# Patient Record
Sex: Male | Born: 1995 | Race: Black or African American | Hispanic: No | Marital: Single | Smoking: Never smoker
Health system: Southern US, Community
[De-identification: ages and names within clinical notes are randomized; demographics above are authoritative.]

## PROBLEM LIST (undated history)

## (undated) DIAGNOSIS — J45909 Unspecified asthma, uncomplicated: Secondary | ICD-10-CM

## (undated) DIAGNOSIS — G43909 Migraine, unspecified, not intractable, without status migrainosus: Secondary | ICD-10-CM

## (undated) HISTORY — PX: CLOSED REDUCTION WRIST FRACTURE: SHX1091

---
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
## 2017-02-06 ENCOUNTER — Emergency Department (HOSPITAL_COMMUNITY): Payer: BLUE CROSS/BLUE SHIELD

## 2017-02-06 ENCOUNTER — Emergency Department (HOSPITAL_COMMUNITY)
Admission: EM | Admit: 2017-02-06 | Discharge: 2017-02-06 | Disposition: A | Discharge status: Home / Self Care | Payer: BLUE CROSS/BLUE SHIELD | Attending: Emergency Medicine | Admitting: Emergency Medicine

## 2017-02-06 ENCOUNTER — Other Ambulatory Visit: Payer: Self-pay

## 2017-02-06 ENCOUNTER — Encounter (HOSPITAL_COMMUNITY): Payer: Self-pay | Admitting: *Deleted

## 2017-02-06 DIAGNOSIS — J45901 Unspecified asthma with (acute) exacerbation: Secondary | ICD-10-CM | POA: Insufficient documentation

## 2017-02-06 DIAGNOSIS — R0789 Other chest pain: Secondary | ICD-10-CM

## 2017-02-06 LAB — CBC
HEMATOCRIT: 45 % (ref 39.0–52.0)
Hemoglobin: 14.8 g/dL (ref 13.0–17.0)
MCH: 29.7 pg (ref 26.0–34.0)
MCHC: 32.9 g/dL (ref 30.0–36.0)
MCV: 90.4 fL (ref 78.0–100.0)
PLATELETS: 225 10*3/uL (ref 150–400)
RBC: 4.98 MIL/uL (ref 4.22–5.81)
RDW: 13.3 % (ref 11.5–15.5)
WBC: 4.8 10*3/uL (ref 4.0–10.5)

## 2017-02-06 LAB — BASIC METABOLIC PANEL
Anion gap: 11 (ref 5–15)
BUN: 7 mg/dL (ref 6–20)
CHLORIDE: 104 mmol/L (ref 101–111)
CO2: 24 mmol/L (ref 22–32)
CREATININE: 0.88 mg/dL (ref 0.61–1.24)
Calcium: 9.4 mg/dL (ref 8.9–10.3)
GFR calc Af Amer: >60 mL/min (ref >60)
GFR calc non Af Amer: >60 mL/min (ref >60)
GLUCOSE: 96 mg/dL (ref 65–99)
POTASSIUM: 3.9 mmol/L (ref 3.5–5.1)
SODIUM: 139 mmol/L (ref 135–145)

## 2017-02-06 LAB — I-STAT TROPONIN, ED: Troponin i, poc: 0.01 ng/mL (ref 0.00–0.08)

## 2017-02-06 MED ORDER — ALBUTEROL SULFATE HFA 108 (90 BASE) MCG/ACT IN AERS: 2.0000 | INHALATION_SPRAY | RESPIRATORY_TRACT | 2 refills | Status: AC | PRN

## 2017-02-06 MED ORDER — ALBUTEROL SULFATE HFA 108 (90 BASE) MCG/ACT IN AERS
1.0000 | INHALATION_SPRAY | RESPIRATORY_TRACT | Status: DC | PRN
  Administered 2017-02-06: 1 via RESPIRATORY_TRACT
  Filled 2017-02-06: qty 6.7

## 2017-02-06 NOTE — ED Notes (Signed)
Lab work, radiology results and vital signs reviewed, no critical results at this time, no change in acuity indicated.  

## 2017-02-06 NOTE — ED Triage Notes (Signed)
Pt reports chest tightness and sob that started this afternoon. Hx of asthma but states this feels different. Pt was unable to find his inhaler pta to use it. ekg done at triage. No resp distress is noted. spo2 98%.

## 2017-02-06 NOTE — ED Notes (Signed)
The pt is  C/o chest pain since 1440 today  His pain is worse with movement and with respirations  Alert oriented skin warm and dry  No distress

## 2017-02-06 NOTE — ED Provider Notes (Signed)
[facility]   CSN: [id number] Arrival date & time: 02/06/17  1506   History   Chief Complaint Chief Complaint  Patient presents with  . Chest Pain  . Shortness of Breath    HPI Billy Salas is a 22 y.o. male.  HPI   22 year old male presents today with complaints of chest pain or shortness of breath.  Patient reports approximately 2:00 today he was doing homework when he felt chest tightness and substernal chest pain.  He notes his heart was racing at that time, he tried to use his inhaler but was unable to find this.  Patient does have a history of asthma.  Patient notes that symptoms have significantly improved without intervention.  He denies any fever, cough, congestion.  Patient has had pain in the past of his chest as he has pectus excavated him, but notes this feels slightly different.  He does endorse some tenderness along the sternum and chest wall, notes that the chest pain is worsened with movement of the torso and chest.  He denies any abdominal pain or indigestion.  Patient denies any heavy lifting, notes he was at the gym today lifting weights that he normally does.  He denies any significant cardiac history.  He endorses some wheezing at that time.    Past Medical History:  Diagnosis Date  . Asthma     Patient Active Problem List   Diagnosis Date Noted  . PCP NOTES >>>>>>>>>>> 10/11/2015  . SOB (shortness of breath) 04/01/2013  . Annual physical exam 10/10/2011    Past Surgical History:  Procedure Laterality Date  . CLOSED REDUCTION WRIST FRACTURE         Home Medications    Prior to Admission medications   Medication Sig Start Date End Date Taking? Authorizing Provider  albuterol (PROVENTIL HFA;VENTOLIN HFA) 108 (90 Base) MCG/ACT inhaler Inhale 2 puffs into the lungs every 4 (four) hours as needed for wheezing or shortness of breath. 02/06/17   Eyvonne MechanicHedges, Sheilia Reznick, PA-C    Family History Family  History  Problem Relation Age of Onset  . Heart failure Other     Social History Social History   Tobacco Use  . Smoking status: Never Smoker  . Smokeless tobacco: Never Used  Substance Use Topics  . Alcohol use: No  . Drug use: No     Allergies   Patient has no known allergies.   Review of Systems Review of Systems  All other systems reviewed and are negative.    Physical Exam Updated Vital Signs BP (!) 143/76   Pulse 64   Temp 98.8 F (37.1 C) (Oral)   Resp 16   Ht 6\' 5"  (1.956 m)   Wt 86.2 kg (190 lb)   SpO2 99%   BMI 22.53 kg/m   Physical Exam  Constitutional: He is oriented to person, place, and time. He appears well-developed and well-nourished.  HENT:  Head: Normocephalic and atraumatic.  Eyes: Conjunctivae are normal. Pupils are equal, round, and reactive to light. Right eye exhibits no discharge. Left eye exhibits no discharge. No scleral icterus.  Neck: Normal range of motion. No JVD present. No tracheal deviation present.  Cardiovascular: Normal rate, regular rhythm and normal heart sounds. Exam reveals no gallop and no friction rub.  No murmur heard. Pulmonary/Chest: Effort normal. No stridor.  Pectus excavating-tenderness to palpation of the sternum, no redness or swelling heart, lung sounds clear throughout no respiratory distress  Musculoskeletal: He exhibits  no edema.  Neurological: He is alert and oriented to person, place, and time. Coordination normal.  Skin: Skin is warm.  Psychiatric: He has a normal mood and affect. His behavior is normal. Judgment and thought content normal.  Nursing note and vitals reviewed.   ED Treatments / Results  Labs (all labs ordered are listed, but only abnormal results are displayed) Labs Reviewed  BASIC METABOLIC PANEL  CBC  I-STAT TROPONIN, ED    EKG  EKG Interpretation None       Radiology Dg Chest 2 View  Result Date: 02/06/2017 CLINICAL DATA:  Mid chest pain, shortness of breath, and  tachycardia for the past 3 hours. History of asthma, pectus excavatum common nonsmoker. EXAM: CHEST  2 VIEW COMPARISON:  Chest x-ray of July 13, 2015 FINDINGS: The lungs are well-expanded. There is no focal infiltrate. There is no pleural effusion, pneumothorax, or pneumomediastinum. The heart and pulmonary vascularity are normal. The mediastinum is normal in width. The bony thorax exhibits no acute abnormality. IMPRESSION: There is no active cardiopulmonary disease. Electronically Signed   By: David  [place] M.D.   On: 02/06/2017 17:00    Procedures Procedures (including critical care time)  Medications Ordered in ED Medications  albuterol (PROVENTIL HFA;VENTOLIN HFA) 108 (90 Base) MCG/ACT inhaler 1 puff (1 puff Inhalation Given 02/06/17 1951)     Initial Impression / Assessment and Plan / ED Course  I have reviewed the triage vital signs and the nursing notes.  Pertinent labs & imaging results that were available during my care of the patient were reviewed by me and considered in my medical decision making (see chart for details).      Final Clinical Impressions(s) / ED Diagnoses   Final diagnoses:  Chest wall pain  Mild asthma with exacerbation, unspecified whether persistent   Labs: I-STAT troponin, BMP, CBC  Imaging: DG chest 2 view  Consults:  Therapeutics:  Discharge Meds: Albuterol  Assessment/Plan: 22 year old male presents today with chest pain.  This is likely multifactorial.  He does have reproducible chest wall pain.  I will suspect that he likely had minor asthma attack with a component of anxiety.  Patient is well-appearing in no acute distress, he has very reassuring laboratory analysis and physical exam.  His vital signs are reassuring as well.  He has no significant past cardiac history, very low suspicion for ACS, no significant risk factors or etiology for PE.  Patient will be discharged home with albuterol, both patient and his family verbalized understanding  and agreement to today's plan.  He is given strict return precautions.  No further questions or concerns at the time of discharge.     ED Discharge Orders        Ordered    albuterol (PROVENTIL HFA;VENTOLIN HFA) 108 (90 Base) MCG/ACT inhaler  Every 4 hours PRN     02/06/17 1942       Rosalio Loud 02/06/17 2010    Linwood Dibbles, MD 02/08/17 1141

## 2017-02-06 NOTE — Discharge Instructions (Signed)
Please read attached information. If you experience any new or worsening signs or symptoms please return to the emergency room for evaluation. Please follow-up with your primary care provider or specialist as discussed. Please use medication prescribed only as directed and discontinue taking if you have any concerning signs or symptoms.   °

## 2017-06-15 ENCOUNTER — Emergency Department (HOSPITAL_COMMUNITY)
Admission: EM | Admit: 2017-06-15 | Discharge: 2017-06-15 | Disposition: A | Discharge status: Home / Self Care | Payer: BLUE CROSS/BLUE SHIELD | Attending: Emergency Medicine | Admitting: Emergency Medicine

## 2017-06-15 ENCOUNTER — Encounter (HOSPITAL_COMMUNITY): Payer: Self-pay | Admitting: Emergency Medicine

## 2017-06-15 DIAGNOSIS — J029 Acute pharyngitis, unspecified: Secondary | ICD-10-CM | POA: Insufficient documentation

## 2017-06-15 DIAGNOSIS — J45909 Unspecified asthma, uncomplicated: Secondary | ICD-10-CM | POA: Insufficient documentation

## 2017-06-15 LAB — GROUP A STREP BY PCR: Group A Strep by PCR: NOT DETECTED

## 2017-06-15 NOTE — ED Triage Notes (Signed)
Patient complains of sinus drainage and sore throat x1 week. Denies fevers, no other complaints at this time.

## 2017-06-15 NOTE — Discharge Instructions (Signed)
Alternate between Tylenol and ibuprofen as needed for pain. Gargle warm salt water and spit it out. It is very important to stay hydrated!  Follow up with your primary care doctor in 5-7 days for recheck of ongoing symptoms and return to emergency department if any new or worsening of symptoms develop or you have any additional concerns.  °

## 2017-06-15 NOTE — ED Provider Notes (Signed)
[facility] EMERGENCY DEPARTMENT Provider Note   CSN: [id number] Arrival date & time: 06/15/17  0705     History   Chief Complaint Chief Complaint  Patient presents with  . URI    HPI Billy Salas is a 22 y.o. male.  The history is provided by the patient and medical records. No language interpreter was used.  URI   Associated symptoms include congestion, rhinorrhea and sore throat. Pertinent negatives include no chest pain, no abdominal pain, no nausea, no vomiting and no cough.   Billy Salas is a 22 y.o. male with hx of asthma who presents to the emergency department complaining of persistent sore throat for the last 5-7 days.  Associated with nasal congestion and sinus drainage.  He has tried cough suppressants and ibuprofen with little improvement.  No known sick contacts.  Father looked in his throat this morning and noticed some small pus pockets, therefore got concerned he may have strep throat. Denies any fever, cough, shortness of breath, wheezing, headache or neck pain.  Past Medical History:  Diagnosis Date  . Asthma     Patient Active Problem List   Diagnosis Date Noted  . PCP NOTES >>>>>>>>>>> 10/11/2015  . SOB (shortness of breath) 04/01/2013  . Annual physical exam 10/10/2011    Past Surgical History:  Procedure Laterality Date  . CLOSED REDUCTION WRIST FRACTURE          Home Medications    Prior to Admission medications   Medication Sig Start Date End Date Taking? Authorizing Provider  albuterol (PROVENTIL HFA;VENTOLIN HFA) 108 (90 Base) MCG/ACT inhaler Inhale 2 puffs into the lungs every 4 (four) hours as needed for wheezing or shortness of breath. 02/06/17   Eyvonne Mechanic, PA-C    Family History Family History  Problem Relation Age of Onset  . Heart failure Other     Social History Social History   Tobacco Use  . Smoking status: Never Smoker  . Smokeless tobacco: Never Used  Substance Use Topics  . Alcohol  use: No  . Drug use: No     Allergies   Patient has no known allergies.   Review of Systems Review of Systems  Constitutional: Negative for chills and fever.  HENT: Positive for congestion, rhinorrhea, sinus pressure and sore throat. Negative for trouble swallowing.   Respiratory: Negative for cough and shortness of breath.   Cardiovascular: Negative for chest pain.  Gastrointestinal: Negative for abdominal pain, nausea and vomiting.     Physical Exam Updated Vital Signs BP 139/68 (BP Location: Right Arm)   Pulse 77   Temp 98.7 F (37.1 C) (Oral)   Resp 17   Ht  (1.956 m)   Wt 81.6 kg (180 lb)   SpO2 100%   BMI 21.34 kg/m   Physical Exam  Constitutional: He is oriented to person, place, and time. He appears well-developed and well-nourished. No distress.  HENT:  Head: Normocephalic and atraumatic.  OP with erythema and very small exudates. No tonsillar hypertrophy. + nasal congestion with mucosal edema. No focal areas of sinus tenderness.  Neck: Normal range of motion. Neck supple.  No meningeal signs.   Cardiovascular: Normal rate, regular rhythm and normal heart sounds.  Pulmonary/Chest: Effort normal.  Lungs are clear to auscultation bilaterally - no w/r/r  Abdominal: Soft. He exhibits no distension. There is no tenderness.  Musculoskeletal: Normal range of motion.  Neurological: He is alert and oriented to person, place, and time.  Skin:  Skin is warm and dry. He is not diaphoretic.  Nursing note and vitals reviewed.    ED Treatments / Results  Labs (all labs ordered are listed, but only abnormal results are displayed) Labs Reviewed  GROUP A STREP BY PCR    EKG None  Radiology No results found.  Procedures Procedures (including critical care time)  Medications Ordered in ED Medications - No data to display   Initial Impression / Assessment and Plan / ED Course  I have reviewed the triage vital signs and the nursing notes.  Pertinent  labs & imaging results that were available during my care of the patient were reviewed by me and considered in my medical decision making (see chart for details).    Billy Salas is a 22 y.o. male who presents to ED for sore throat and nasal congestion. Tolerating secretions and PO without any difficulty. Clear lungs. Strep PCR negative. Evaluation does not show pathology that would require ongoing emergent intervention or inpatient treatment. Will treat symptomatically and have patient follow up with PCP if symptoms persist. Return precautions discussed and all questions answered.    Final Clinical Impressions(s) / ED Diagnoses   Final diagnoses:  Viral pharyngitis    ED Discharge Orders    None       Jolissa Kapral, Chase Picket, PA-C 06/15/17 1610    Charlynne Pander, MD 06/15/17 [phone]

## 2020-11-02 ENCOUNTER — Ambulatory Visit (HOSPITAL_COMMUNITY)
Admission: EM | Admit: 2020-11-02 | Discharge: 2020-11-02 | Disposition: A | Discharge status: Home / Self Care | Payer: 59 | Attending: Physician Assistant | Admitting: Physician Assistant

## 2020-11-02 ENCOUNTER — Other Ambulatory Visit: Payer: Self-pay

## 2020-11-02 ENCOUNTER — Encounter (HOSPITAL_COMMUNITY): Payer: Self-pay

## 2020-11-02 DIAGNOSIS — R194 Change in bowel habit: Secondary | ICD-10-CM | POA: Diagnosis not present

## 2020-11-02 DIAGNOSIS — R1084 Generalized abdominal pain: Secondary | ICD-10-CM | POA: Diagnosis not present

## 2020-11-02 DIAGNOSIS — R109 Unspecified abdominal pain: Secondary | ICD-10-CM

## 2020-11-02 MED ORDER — HYOSCYAMINE SULFATE SL 0.125 MG SL SUBL: 0.1250 mg | SUBLINGUAL_TABLET | Freq: Two times a day (BID) | SUBLINGUAL | 0 refills | Status: AC | PRN

## 2020-11-02 NOTE — Discharge Instructions (Signed)
Take Levsin twice a day as needed for symptoms.  Make sure you rest and drink plenty of fluid.  I recommend you eat a bland diet such as brat diet (bananas, rice, applesauce, toast) and avoid spicy/acidic/fatty foods as we discussed.  I do think it is reasonable to follow-up with GI specialist if your symptoms are not improving.  If at any point have worsening symptoms including severe abdominal pain, fever, nausea, vomiting, blood in your stool you need to be seen immediately as we discussed.

## 2020-11-02 NOTE — ED Triage Notes (Signed)
Pt presents with recurrent abdominal discomfort that patient describes as a gnawing, cramping feeling since Saturday.

## 2020-11-02 NOTE — ED Provider Notes (Signed)
[facility]    CSN: [id number] Arrival date & time: 11/02/20  1435      History   Chief Complaint Chief Complaint  Patient presents with   Abdominal Pain    HPI Billy Salas is a 25 y.o. male.   Patient presents today with a 4-day history of stomach symptoms.  Reports symptoms began as flatulence but then developed to include generalized abdominal cramping pain with changes in bowel habits.  He reports the sensation that he needs this to the bathroom but has had smaller stools.  Last bowel movement was earlier today and was described as small but otherwise normal without blood or mucus.  He reports occasional nausea but denies any vomiting.  He has had difficulty with his daily activities including work duties as result of symptoms.  Denies any changes to diet, suspicious food intake, recent travel, medication changes, antibiotic use, known sick contacts.  He denies history of gastrointestinal disorder including IBS or inflammatory bowel disease.  Denies previous abdominal surgeries; still has gallbladder and appendix.   Past Medical History:  Diagnosis Date   Asthma     Patient Active Problem List   Diagnosis Date Noted   PCP NOTES >>>>>>>>>>> 10/11/2015   SOB (shortness of breath) 04/01/2013   Annual physical exam 10/10/2011    Past Surgical History:  Procedure Laterality Date   CLOSED REDUCTION WRIST FRACTURE         Home Medications    Prior to Admission medications   Medication Sig Start Date End Date Taking? Authorizing Provider  Hyoscyamine Sulfate SL (LEVSIN/SL) 0.125 MG SUBL Place 0.125 mg under the tongue 2 (two) times daily as needed. 11/02/20  Yes Schylar Wuebker K, PA-C  albuterol (PROVENTIL HFA;VENTOLIN HFA) 108 (90 Base) MCG/ACT inhaler Inhale 2 puffs into the lungs every 4 (four) hours as needed for wheezing or shortness of breath. 02/06/17   Hedges, Tinnie Gens, PA-C    Family History Family History  Problem Relation Age of Onset   Heart  failure Other     Social History Social History   Tobacco Use   Smoking status: Never   Smokeless tobacco: Never  Substance Use Topics   Alcohol use: No   Drug use: No     Allergies   Patient has no known allergies.   Review of Systems Review of Systems  Constitutional:  Positive for activity change. Negative for appetite change, fatigue and fever.  Respiratory:  Negative for cough and shortness of breath.   Cardiovascular:  Negative for chest pain.  Gastrointestinal:  Positive for abdominal pain and nausea. Negative for blood in stool, constipation, diarrhea and vomiting.  Musculoskeletal:  Negative for arthralgias and myalgias.  Neurological:  Negative for dizziness, light-headedness and headaches.    Physical Exam Triage Vital Signs ED Triage Vitals  Enc Vitals Group     BP 11/02/20 1526 130/70     Pulse Rate 11/02/20 1526 (!) 59     Resp 11/02/20 1526 18     Temp 11/02/20 1526 99.4 F (37.4 C)     Temp Source 11/02/20 1526 Oral     SpO2 11/02/20 1526 100 %     Weight --      Height --      Head Circumference --      Peak Flow --      Pain Score 11/02/20 1529 6     Pain Loc --      Pain Edu? --  Excl. in GC? --    No data found.  Updated Vital Signs BP 130/70 (BP Location: Left Arm)   Pulse (!) 59   Temp 99.4 F (37.4 C) (Oral)   Resp 18   SpO2 100%   Visual Acuity Right Eye Distance:   Left Eye Distance:   Bilateral Distance:    Right Eye Near:   Left Eye Near:    Bilateral Near:     Physical Exam Vitals reviewed.  Constitutional:      General: He is awake.     Appearance: Normal appearance. He is well-developed. He is not ill-appearing.     Comments: Very pleasant male appears stated age in no acute distress sitting comfortably in exam room  HENT:     Head: Normocephalic and atraumatic.     Mouth/Throat:     Mouth: Mucous membranes are moist.     Pharynx: Uvula midline. No oropharyngeal exudate or posterior oropharyngeal erythema.   Cardiovascular:     Rate and Rhythm: Normal rate and regular rhythm.     Heart sounds: Normal heart sounds, S1 normal and S2 normal. No murmur heard. Pulmonary:     Effort: Pulmonary effort is normal.     Breath sounds: Normal breath sounds. No stridor. No wheezing, rhonchi or rales.     Comments: Clear to auscultation bilaterally Abdominal:     General: Bowel sounds are normal.     Palpations: Abdomen is soft.     Tenderness: There is no abdominal tenderness. There is no right CVA tenderness, left CVA tenderness, guarding or rebound.     Comments: Benign abdominal exam; nontender to palpation.  No CVA tenderness.  No evidence of acute abdomen on physical exam.  Neurological:     Mental Status: He is alert.  Psychiatric:        Behavior: Behavior is cooperative.     UC Treatments / Results  Labs (all labs ordered are listed, but only abnormal results are displayed) Labs Reviewed - No data to display  EKG   Radiology No results found.  Procedures Procedures (including critical care time)  Medications Ordered in UC Medications - No data to display  Initial Impression / Assessment and Plan / UC Course  I have reviewed the triage vital signs and the nursing notes.  Pertinent labs & imaging results that were available during my care of the patient were reviewed by me and considered in my medical decision making (see chart for details).      Vital signs and physical exam reassuring today; no indication for emergent evaluation or imaging.  Given patient has been able to eat and drink and has not had significant diarrhea or nausea additional testing including lab work and stool studies was not obtained.  Will treat symptomatically with Levsin twice daily as needed.  Recommended that he rest and drink plenty of fluid.  Discussed the importance of a bland diet and instructed him to avoid spicy/acidic/fatty foods.  He was given work excuse note for several days to allow symptoms to  resolve prior to having to return to work.  Discussed that if symptoms persist or if at any point anything worsens he is to be reevaluated.  Discussed alarm symptoms that warrant emergent evaluation including fever, blood in stool, vomiting, hematochezia, melena, lightheadedness, focal abdominal pain.  Strict return precautions given to which he expressed understanding.  Final Clinical Impressions(s) / UC Diagnoses   Final diagnoses:  Generalized abdominal pain  Abdominal cramping  Change in  bowel habit     Discharge Instructions      Take Levsin twice a day as needed for symptoms.  Make sure you rest and drink plenty of fluid.  I recommend you eat a bland diet such as brat diet (bananas, rice, applesauce, toast) and avoid spicy/acidic/fatty foods as we discussed.  I do think it is reasonable to follow-up with GI specialist if your symptoms are not improving.  If at any point have worsening symptoms including severe abdominal pain, fever, nausea, vomiting, blood in your stool you need to be seen immediately as we discussed.     ED Prescriptions     Medication Sig Dispense Auth. Provider   Hyoscyamine Sulfate SL (LEVSIN/SL) 0.125 MG SUBL Place 0.125 mg under the tongue 2 (two) times daily as needed. 20 tablet Zakiyyah Savannah, Noberto Retort, PA-C      PDMP not reviewed this encounter.   Jeani Hawking, PA-C 11/02/20 1605

## 2020-11-29 ENCOUNTER — Ambulatory Visit (HOSPITAL_COMMUNITY)
Admission: EM | Admit: 2020-11-29 | Discharge: 2020-11-29 | Disposition: A | Discharge status: Home / Self Care | Payer: 59 | Attending: Emergency Medicine | Admitting: Emergency Medicine

## 2020-11-29 ENCOUNTER — Encounter (HOSPITAL_COMMUNITY): Payer: Self-pay | Admitting: Emergency Medicine

## 2020-11-29 ENCOUNTER — Other Ambulatory Visit: Payer: Self-pay

## 2020-11-29 DIAGNOSIS — B349 Viral infection, unspecified: Secondary | ICD-10-CM | POA: Diagnosis not present

## 2020-11-29 DIAGNOSIS — J45909 Unspecified asthma, uncomplicated: Secondary | ICD-10-CM | POA: Insufficient documentation

## 2020-11-29 DIAGNOSIS — Z20822 Contact with and (suspected) exposure to covid-19: Secondary | ICD-10-CM | POA: Insufficient documentation

## 2020-11-29 LAB — POC INFLUENZA A AND B ANTIGEN (URGENT CARE ONLY)
INFLUENZA A ANTIGEN, POC: NEGATIVE
INFLUENZA B ANTIGEN, POC: NEGATIVE

## 2020-11-29 MED ORDER — IBUPROFEN 800 MG PO TABS: 800.0000 mg | ORAL_TABLET | Freq: Three times a day (TID) | ORAL | 0 refills | Status: AC

## 2020-11-29 MED ORDER — LIDOCAINE VISCOUS HCL 2 % MT SOLN: 15.0000 mL | OROMUCOSAL | 0 refills | Status: AC | PRN

## 2020-11-29 NOTE — ED Triage Notes (Signed)
Pt reports productive cough, runny nose, congestion since Sunday.

## 2020-11-29 NOTE — ED Provider Notes (Addendum)
[facility]    CSN: [id number] Arrival date & time: 11/29/20  1238      History   Chief Complaint Chief Complaint  Patient presents with   Cough   Nasal Congestion    HPI HILERY WINTLE is a 25 y.o. male.    Patient presents with nasal congestion, rhinorrhea, nonproductive cough, sore throat and intermittent generalized headaches for 3 days.  Tolerating food and liquids.  Known sick contact.  Has not attempted treatment of symptoms.  History of asthma.  Denies ear pain, shortness of breath, wheezing, abdominal pain, nausea, vomiting, diarrhea.   Past Medical History:  Diagnosis Date   Asthma     Patient Active Problem List   Diagnosis Date Noted   PCP NOTES >>>>>>>>>>> 10/11/2015   SOB (shortness of breath) 04/01/2013   Annual physical exam 10/10/2011    Past Surgical History:  Procedure Laterality Date   CLOSED REDUCTION WRIST FRACTURE         Home Medications    Prior to Admission medications   Medication Sig Start Date End Date Taking? Authorizing Provider  albuterol (PROVENTIL HFA;VENTOLIN HFA) 108 (90 Base) MCG/ACT inhaler Inhale 2 puffs into the lungs every 4 (four) hours as needed for wheezing or shortness of breath. 02/06/17   Hedges, Tinnie Gens, PA-C  Hyoscyamine Sulfate SL (LEVSIN/SL) 0.125 MG SUBL Place 0.125 mg under the tongue 2 (two) times daily as needed. 11/02/20   Raspet, Noberto Retort, PA-C    Family History Family History  Problem Relation Age of Onset   Heart failure Other     Social History Social History   Tobacco Use   Smoking status: Never   Smokeless tobacco: Never  Substance Use Topics   Alcohol use: No   Drug use: No     Allergies   Patient has no known allergies.   Review of Systems Review of Systems  Constitutional: Negative.   HENT:  Positive for congestion, rhinorrhea and sore throat. Negative for dental problem, drooling, ear discharge, ear pain, facial swelling, hearing loss, mouth sores, nosebleeds,  postnasal drip, sinus pressure, sinus pain, sneezing, tinnitus, trouble swallowing and voice change.   Respiratory:  Positive for cough. Negative for apnea, choking, chest tightness, shortness of breath, wheezing and stridor.   Gastrointestinal: Negative.   Skin: Negative.   Neurological:  Positive for headaches. Negative for dizziness, tremors, seizures, syncope, facial asymmetry, speech difficulty, weakness, light-headedness and numbness.    Physical Exam Triage Vital Signs ED Triage Vitals  Enc Vitals Group     BP 11/29/20 1332 128/75     Pulse Rate 11/29/20 1332 70     Resp 11/29/20 1332 15     Temp 11/29/20 1332 (!) 100.4 F (38 C)     Temp Source 11/29/20 1332 Oral     SpO2 11/29/20 1332 100 %     Weight --      Height --      Head Circumference --      Peak Flow --      Pain Score 11/29/20 1331 3     Pain Loc --      Pain Edu? --      Excl. in GC? --    No data found.  Updated Vital Signs BP 128/75   Pulse 70   Temp (!) 100.4 F (38 C) (Oral)   Resp 15   SpO2 100%   Visual Acuity Right Eye Distance:   Left Eye Distance:   Bilateral Distance:  Right Eye Near:   Left Eye Near:    Bilateral Near:     Physical Exam Constitutional:      Appearance: Normal appearance. He is normal weight.  HENT:     Head: Normocephalic.     Right Ear: Tympanic membrane, ear canal and external ear normal.     Left Ear: Tympanic membrane, ear canal and external ear normal.     Nose: Congestion and rhinorrhea present.     Mouth/Throat:     Mouth: Mucous membranes are moist.     Pharynx: Posterior oropharyngeal erythema present.  Eyes:     Extraocular Movements: Extraocular movements intact.  Cardiovascular:     Rate and Rhythm: Normal rate and regular rhythm.     Pulses: Normal pulses.     Heart sounds: Normal heart sounds.  Pulmonary:     Effort: Pulmonary effort is normal.     Breath sounds: Normal breath sounds.  Skin:    General: Skin is warm and dry.   Neurological:     Mental Status: He is alert and oriented to person, place, and time. Mental status is at baseline.  Psychiatric:        Mood and Affect: Mood normal.        Behavior: Behavior normal.     UC Treatments / Results  Labs (all labs ordered are listed, but only abnormal results are displayed) Labs Reviewed - No data to display  EKG   Radiology No results found.  Procedures Procedures (including critical care time)  Medications Ordered in UC Medications - No data to display  Initial Impression / Assessment and Plan / UC Course  I have reviewed the triage vital signs and the nursing notes.  Pertinent labs & imaging results that were available during my care of the patient were reviewed by me and considered in my medical decision making (see chart for details).  Viral illness  Discussed etiology of symptoms with patient, timeline and possible resolution, covid and flu test pending  1.  Ibuprofen 800 mg 3 times daily as needed 2.  Lidocaine viscous 2% 15 mg every 4 hours as needed 3.  Over-the-counter medication for remaining symptom management 4.  Work note given 5.  Urgent care follow-up as needed Final Clinical Impressions(s) / UC Diagnoses   Final diagnoses:  None   Discharge Instructions   None    ED Prescriptions   None    PDMP not reviewed this encounter.   Valinda Hoar, NP 11/29/20 1448    Valinda Hoar, NP 11/29/20 1449

## 2020-11-29 NOTE — Discharge Instructions (Addendum)
We will contact you if your COVID or flu test is positive.  Please quarantine while you wait for the results.  If your test is negative you may resume normal activities.  If your test is positive please continue to quarantine for at least 5 days from your symptom onset or until you are without a fever for at least 24 hours after the medications.    You may [place] ibuprofen every 8 hours as needed  You may gargle then spit solution for temporary relief every 4 hours as needed   For cough: honey 1/2 to 1 teaspoon (you can dilute the honey in water or another fluid).  You can also use guaifenesin and dextromethorphan for cough. You can use a humidifier for chest congestion and cough.  If you don't have a humidifier, you can sit in the bathroom with the hot shower running.      For sore throat: try warm salt water gargles, cepacol lozenges, throat spray, warm tea or water with lemon/honey, popsicles or ice, or OTC cold relief medicine for throat discomfort.   For congestion: take a daily anti-histamine like Zyrtec, Claritin, and a oral decongestant, such as pseudoephedrine.  You can also use Flonase 1-2 sprays in each nostril daily.   It is important to stay hydrated: drink plenty of fluids (water, gatorade/powerade/pedialyte, juices, or teas) to keep your throat moisturized and help further relieve irritation/discomfort.

## 2020-11-30 LAB — SARS CORONAVIRUS 2 (TAT 6-24 HRS): SARS Coronavirus 2: NEGATIVE

## 2020-12-05 ENCOUNTER — Other Ambulatory Visit: Payer: Self-pay

## 2020-12-05 ENCOUNTER — Emergency Department (HOSPITAL_COMMUNITY)
Admission: EM | Admit: 2020-12-05 | Discharge: 2020-12-05 | Disposition: A | Discharge status: Home / Self Care | Payer: 59 | Attending: Emergency Medicine | Admitting: Emergency Medicine

## 2020-12-05 ENCOUNTER — Emergency Department (HOSPITAL_BASED_OUTPATIENT_CLINIC_OR_DEPARTMENT_OTHER)
Admission: EM | Admit: 2020-12-05 | Discharge: 2020-12-05 | Disposition: A | Discharge status: Home / Self Care | Payer: 59 | Source: Home / Self Care | Attending: Emergency Medicine | Admitting: Emergency Medicine

## 2020-12-05 ENCOUNTER — Encounter (HOSPITAL_COMMUNITY): Payer: Self-pay

## 2020-12-05 ENCOUNTER — Encounter (HOSPITAL_BASED_OUTPATIENT_CLINIC_OR_DEPARTMENT_OTHER): Payer: Self-pay

## 2020-12-05 DIAGNOSIS — J321 Chronic frontal sinusitis: Secondary | ICD-10-CM

## 2020-12-05 DIAGNOSIS — G43909 Migraine, unspecified, not intractable, without status migrainosus: Secondary | ICD-10-CM | POA: Insufficient documentation

## 2020-12-05 DIAGNOSIS — J45909 Unspecified asthma, uncomplicated: Secondary | ICD-10-CM | POA: Insufficient documentation

## 2020-12-05 DIAGNOSIS — H53149 Visual discomfort, unspecified: Secondary | ICD-10-CM | POA: Insufficient documentation

## 2020-12-05 DIAGNOSIS — R519 Headache, unspecified: Secondary | ICD-10-CM | POA: Diagnosis present

## 2020-12-05 DIAGNOSIS — Z5321 Procedure and treatment not carried out due to patient leaving prior to being seen by health care provider: Secondary | ICD-10-CM | POA: Insufficient documentation

## 2020-12-05 HISTORY — DX: Migraine, unspecified, not intractable, without status migrainosus: G43.909

## 2020-12-05 LAB — BASIC METABOLIC PANEL WITH GFR
Anion gap: 10 (ref 5–15)
BUN: 11 mg/dL (ref 6–20)
CO2: 27 mmol/L (ref 22–32)
Calcium: 9.5 mg/dL (ref 8.9–10.3)
Chloride: 102 mmol/L (ref 98–111)
Creatinine, Ser: 0.79 mg/dL (ref 0.61–1.24)
GFR, Estimated: >60 mL/min
Glucose, Bld: 91 mg/dL (ref 70–99)
Potassium: 3.7 mmol/L (ref 3.5–5.1)
Sodium: 139 mmol/L (ref 135–145)

## 2020-12-05 LAB — CBC
HCT: 40.7 % (ref 39.0–52.0)
Hemoglobin: 13 g/dL (ref 13.0–17.0)
MCH: 29.2 pg (ref 26.0–34.0)
MCHC: 31.9 g/dL (ref 30.0–36.0)
MCV: 91.5 fL (ref 80.0–100.0)
Platelets: 297 K/uL (ref 150–400)
RBC: 4.45 MIL/uL (ref 4.22–5.81)
RDW: 12.6 % (ref 11.5–15.5)
WBC: 8.9 K/uL (ref 4.0–10.5)
nRBC: 0 % (ref 0.0–0.2)

## 2020-12-05 MED ORDER — KETOROLAC TROMETHAMINE 15 MG/ML IJ SOLN
15.0000 mg | Freq: Once | INTRAMUSCULAR | Status: AC
  Administered 2020-12-05: 15 mg via INTRAMUSCULAR
  Filled 2020-12-05: qty 1

## 2020-12-05 MED ORDER — AMOXICILLIN-POT CLAVULANATE 875-125 MG PO TABS: 1.0000 | ORAL_TABLET | Freq: Two times a day (BID) | ORAL | 0 refills | Status: AC

## 2020-12-05 MED ORDER — DIPHENHYDRAMINE HCL 50 MG/ML IJ SOLN
25.0000 mg | Freq: Once | INTRAMUSCULAR | Status: AC
  Administered 2020-12-05: 25 mg via INTRAVENOUS
  Filled 2020-12-05: qty 1

## 2020-12-05 MED ORDER — AMOXICILLIN-POT CLAVULANATE 875-125 MG PO TABS
1.0000 | ORAL_TABLET | Freq: Once | ORAL | Status: AC
  Administered 2020-12-05: 1 via ORAL
  Filled 2020-12-05: qty 1

## 2020-12-05 MED ORDER — DEXAMETHASONE 4 MG PO TABS
10.0000 mg | ORAL_TABLET | Freq: Once | ORAL | Status: AC
  Administered 2020-12-05: 10 mg via ORAL
  Filled 2020-12-05: qty 3

## 2020-12-05 MED ORDER — DROPERIDOL 2.5 MG/ML IJ SOLN
1.2500 mg | Freq: Once | INTRAMUSCULAR | Status: AC
  Administered 2020-12-05: 1.25 mg via INTRAVENOUS
  Filled 2020-12-05: qty 2

## 2020-12-05 MED ORDER — METOCLOPRAMIDE HCL 5 MG/ML IJ SOLN
10.0000 mg | Freq: Once | INTRAMUSCULAR | Status: AC
  Administered 2020-12-05: 10 mg via INTRAMUSCULAR
  Filled 2020-12-05: qty 2

## 2020-12-05 MED ORDER — SODIUM CHLORIDE 0.9 % IV BOLUS
1000.0000 mL | Freq: Once | INTRAVENOUS | Status: AC
  Administered 2020-12-05: 1000 mL via INTRAVENOUS

## 2020-12-05 NOTE — ED Notes (Signed)
Pt left AMA due to wait time  

## 2020-12-05 NOTE — ED Provider Notes (Signed)
Emergency Medicine Provider Triage Evaluation Note  Billy Salas , a 25 y.o. male  was evaluated in triage.  Pt complains of intermittent frontal headache with associated photophobia x 1 week. Had some preceding URI symptoms, congestion. No fevers, head injury, vomiting, extremity numbness or paresthesias, extremity weakness.  Review of Systems  Positive: As above Negative: As above  Physical Exam  BP 117/77 (BP Location: Left Arm)   Pulse 76   Temp 98 F (36.7 C)   Resp 16   SpO2 99%  Gen:   Awake, no distress   Resp:  Normal effort  MSK:   Moves extremities without difficulty  Other:  No focal deficits noted  Medical Decision Making  Medically screening exam initiated at 3:19 AM.  Appropriate orders placed.  Billy Salas was informed that the remainder of the evaluation will be completed by another provider, this initial triage assessment does not replace that evaluation, and the importance of remaining in the ED until their evaluation is complete.  Frontal headache - medications ordered pending formal ED evaluation.   Antony Madura, PA-C 12/05/20 0320    Dione Booze, MD 12/05/20 [phone]

## 2020-12-05 NOTE — ED Notes (Signed)
Patient verbalizes understanding of discharge instructions. Opportunity for questioning and answers were provided. Patient discharged from ED.  °

## 2020-12-05 NOTE — Discharge Instructions (Signed)
Take tylenol 2 pills 4 times a day and motrin 4 pills 3 times a day.  Drink plenty of fluids.  Return for worsening shortness of breath, headache, confusion. Follow up with your family doctor.   

## 2020-12-05 NOTE — ED Triage Notes (Addendum)
Pt arrives POV states he has had a migraine for approximately one week.  Denies N/V.  Endorses some photosensitivity but denies any vision changes.   Was seen at [facility] ED earlier this am and was given IM injection of Reglan and Toradol.  Has also tried Ibuprofen 800 mg without relief.

## 2020-12-05 NOTE — ED Provider Notes (Signed)
[facility] EMERGENCY DEPT Provider Note   CSN: [id number] Arrival date & time: 12/05/20  0908     History Chief Complaint  Patient presents with   Migraine    Billy Salas is a 25 y.o. male.  25 yo M with a chief complaints of a headache.  This been going on for about a week.  Preceded by an upper respiratory illness cough congestion fevers.  Was at the [facility] emergency department overnight last night but did not wait to be seen.  Was given Reglan and Toradol and triage with out relief.  Denies ongoing fevers or chills.  Denies head trauma.  Denies one-sided numbness or weakness denies difficulty speech or swallowing.  The history is provided by the patient.  Migraine This is a new problem. The current episode started 2 days ago. The problem occurs constantly. Associated symptoms include headaches. Pertinent negatives include no chest pain, no abdominal pain and no shortness of breath. Nothing aggravates the symptoms. Nothing relieves the symptoms. He has tried nothing for the symptoms. The treatment provided no relief.      Past Medical History:  Diagnosis Date   Asthma    Migraine    optic migraine    Patient Active Problem List   Diagnosis Date Noted   PCP NOTES >>>>>>>>>>> 10/11/2015   SOB (shortness of breath) 04/01/2013   Annual physical exam 10/10/2011    Past Surgical History:  Procedure Laterality Date   CLOSED REDUCTION WRIST FRACTURE         Family History  Problem Relation Age of Onset   Heart failure Other     Social History   Tobacco Use   Smoking status: Never   Smokeless tobacco: Never  Vaping Use   Vaping Use: Never used  Substance Use Topics   Alcohol use: Yes    Comment: occ   Drug use: Yes    Types: Marijuana    Comment: daily use    Home Medications Prior to Admission medications   Medication Sig Start Date End Date Taking? Authorizing Provider  amoxicillin-clavulanate (AUGMENTIN) 875-125 MG tablet Take 1 tablet  by mouth every 12 (twelve) hours. 12/05/20  Yes Melene Plan, DO  albuterol (PROVENTIL HFA;VENTOLIN HFA) 108 (90 Base) MCG/ACT inhaler Inhale 2 puffs into the lungs every 4 (four) hours as needed for wheezing or shortness of breath. 02/06/17   Hedges, Tinnie Gens, PA-C  cetirizine (ZYRTEC) 10 MG tablet Take 10 mg by mouth daily as needed for allergies.    [provider]  Hyoscyamine Sulfate SL (LEVSIN/SL) 0.125 MG SUBL Place 0.125 mg under the tongue 2 (two) times daily as needed. Patient not taking: Reported on 12/05/2020 11/02/20   Raspet, Noberto Retort, PA-C  ibuprofen (ADVIL) 800 MG tablet Take 1 tablet (800 mg total) by mouth 3 (three) times daily. Patient taking differently: Take 800 mg by mouth every 8 (eight) hours as needed for headache or moderate pain. 11/29/20   White, Elita Boone, NP  lidocaine (XYLOCAINE) 2 % solution Use as directed 15 mLs in the mouth or throat every 4 (four) hours as needed for mouth pain. 11/29/20   Valinda Hoar, NP    Allergies    Patient has no known allergies.  Review of Systems   Review of Systems  Constitutional:  Negative for chills and fever.  HENT:  Positive for congestion. Negative for facial swelling.   Eyes:  Negative for discharge and visual disturbance.  Respiratory:  Positive for cough. Negative for shortness  of breath.   Cardiovascular:  Negative for chest pain and palpitations.  Gastrointestinal:  Negative for abdominal pain, diarrhea and vomiting.  Musculoskeletal:  Negative for arthralgias and myalgias.  Skin:  Negative for color change and rash.  Neurological:  Positive for headaches. Negative for tremors and syncope.  Psychiatric/Behavioral:  Negative for confusion and dysphoric mood.    Physical Exam Updated Vital Signs BP 122/67   Pulse 67   Temp 98.3 F (36.8 C) (Oral)   Resp 16   Ht 6\' 6"  (1.981 m)   Wt 86.2 kg   SpO2 100%   BMI 21.96 kg/m   Physical Exam Vitals and nursing note reviewed.  Constitutional:       Appearance: He is well-developed.  HENT:     Head: Normocephalic and atraumatic.  Eyes:     Pupils: Pupils are equal, round, and reactive to light.  Neck:     Vascular: No JVD.  Cardiovascular:     Rate and Rhythm: Normal rate and regular rhythm.     Heart sounds: No murmur heard.   No friction rub. No gallop.  Pulmonary:     Effort: No respiratory distress.     Breath sounds: No wheezing.  Abdominal:     General: There is no distension.     Tenderness: There is no abdominal tenderness. There is no guarding or rebound.  Musculoskeletal:        General: Normal range of motion.     Cervical back: Normal range of motion and neck supple.  Skin:    Coloration: Skin is not pale.     Findings: No rash.  Neurological:     Mental Status: He is alert and oriented to person, place, and time.     GCS: GCS eye subscore is 4. GCS verbal subscore is 5. GCS motor subscore is 6.     Cranial Nerves: Cranial nerves 2-12 are intact.     Sensory: Sensation is intact.     Motor: Motor function is intact.     Coordination: Coordination is intact.     Comments: Benign neuro exam able to walk in crocs.  Psychiatric:        Behavior: Behavior normal.    ED Results / Procedures / Treatments   Labs (all labs ordered are listed, but only abnormal results are displayed) Labs Reviewed  BASIC METABOLIC PANEL  CBC    EKG None  Radiology No results found.  Procedures Procedures   Medications Ordered in ED Medications  droperidol (INAPSINE) 2.5 MG/ML injection 1.25 mg (1.25 mg Intravenous Given 12/05/20 1036)  sodium chloride 0.9 % bolus 1,000 mL (0 mLs Intravenous Stopped 12/05/20 1226)  diphenhydrAMINE (BENADRYL) injection 25 mg (25 mg Intravenous Given 12/05/20 1038)  amoxicillin-clavulanate (AUGMENTIN) 875-125 MG per tablet 1 tablet (1 tablet Oral Given 12/05/20 1039)  dexamethasone (DECADRON) tablet 10 mg (10 mg Oral Given 12/05/20 1219)    ED Course  I have reviewed the triage vital  signs and the nursing notes.  Pertinent labs & imaging results that were available during my care of the patient were reviewed by me and considered in my medical decision making (see chart for details).    MDM Rules/Calculators/A&P                           25 yo M with a chief complaints of headache.  Going on for the past week.  History of migraines and thinks this feels  the same but somewhat worse.  Patient with upper respiratory symptoms and sinus tenderness to percussion likely sinusitis.  Will start on antibiotics.  Headache cocktail reassess.  Patient is feeling a bit better after headache cocktail.  Will discharge home.  PCP follow-up.  12:49 PM:  I have discussed the diagnosis/risks/treatment options with the  Patient and believe the pt to be eligible for discharge home to follow-up with PCP. We also discussed returning to the ED immediately if new or worsening sx occur. We discussed the sx which are most concerning (e.g., sudden worsening pain, fever, inability to tolerate by mouth) that necessitate immediate return. Medications administered to the patient during their visit and any new prescriptions provided to the patient are listed below.  Medications given during this visit Medications  droperidol (INAPSINE) 2.5 MG/ML injection 1.25 mg (1.25 mg Intravenous Given 12/05/20 1036)  sodium chloride 0.9 % bolus 1,000 mL (0 mLs Intravenous Stopped 12/05/20 1226)  diphenhydrAMINE (BENADRYL) injection 25 mg (25 mg Intravenous Given 12/05/20 1038)  amoxicillin-clavulanate (AUGMENTIN) 875-125 MG per tablet 1 tablet (1 tablet Oral Given 12/05/20 1039)  dexamethasone (DECADRON) tablet 10 mg (10 mg Oral Given 12/05/20 1219)     The patient appears reasonably screen and/or stabilized for discharge and I doubt any other medical condition or other [facility] requiring further screening, evaluation, or treatment in the ED at this time prior to discharge.   Final Clinical Impression(s) / ED  Diagnoses Final diagnoses:  Sinusitis chronic, frontal  Migraine syndrome    Rx / DC Orders ED Discharge Orders          Ordered    amoxicillin-clavulanate (AUGMENTIN) 875-125 MG tablet  Every 12 hours        12/05/20 1115             Deno Etienne, DO 12/05/20 1249

## 2020-12-05 NOTE — ED Triage Notes (Signed)
Pt states that he has had a migraine for the past week that comes and goes, photosensitive, denies nausea.

## 2020-12-07 ENCOUNTER — Other Ambulatory Visit: Payer: Self-pay

## 2020-12-07 ENCOUNTER — Emergency Department (HOSPITAL_BASED_OUTPATIENT_CLINIC_OR_DEPARTMENT_OTHER)
Admission: EM | Admit: 2020-12-07 | Discharge: 2020-12-07 | Disposition: A | Discharge status: Home / Self Care | Payer: 59 | Attending: Emergency Medicine | Admitting: Emergency Medicine

## 2020-12-07 ENCOUNTER — Encounter (HOSPITAL_BASED_OUTPATIENT_CLINIC_OR_DEPARTMENT_OTHER): Payer: Self-pay

## 2020-12-07 DIAGNOSIS — R220 Localized swelling, mass and lump, head: Secondary | ICD-10-CM | POA: Insufficient documentation

## 2020-12-07 DIAGNOSIS — J45909 Unspecified asthma, uncomplicated: Secondary | ICD-10-CM | POA: Diagnosis not present

## 2020-12-07 DIAGNOSIS — L03213 Periorbital cellulitis: Secondary | ICD-10-CM

## 2020-12-07 NOTE — ED Triage Notes (Signed)
Onset last night of swelling around eye.  Noted some drainage on eye lashes. Denies any vision changes.  Yesterday eyes itchy

## 2020-12-07 NOTE — Discharge Instructions (Signed)
Apply a warm towel to your upper eyelid 3 times a day for 10 minutes Return here if you develop any trouble with vision, eye begins to bulge out, or any other problems

## 2020-12-07 NOTE — ED Provider Notes (Addendum)
[facility] EMERGENCY DEPT Provider Note   CSN: [id number] Arrival date & time: 12/07/20  2706     History Chief Complaint  Patient presents with   Facial Swelling    Billy Salas is a 25 y.o. male.  25 year old male presents with swelling to his left upper eyelid.  Denies any history of trauma.  Has noted some crusting to his eyelashes on left upper eyelid.  No fever or chills.  Seen recently and is on amoxicillin at this time.  Denies any blurred vision or diplopia.  No eyeball pain.      Past Medical History:  Diagnosis Date   Asthma    Migraine    optic migraine    Patient Active Problem List   Diagnosis Date Noted   PCP NOTES >>>>>>>>>>> 10/11/2015   SOB (shortness of breath) 04/01/2013   Annual physical exam 10/10/2011    Past Surgical History:  Procedure Laterality Date   CLOSED REDUCTION WRIST FRACTURE         Family History  Problem Relation Age of Onset   Heart failure Other     Social History   Tobacco Use   Smoking status: Never   Smokeless tobacco: Never  Vaping Use   Vaping Use: Never used  Substance Use Topics   Alcohol use: Yes    Comment: occ   Drug use: Yes    Types: Marijuana    Comment: daily use    Home Medications Prior to Admission medications   Medication Sig Start Date End Date Taking? Authorizing Provider  albuterol (PROVENTIL HFA;VENTOLIN HFA) 108 (90 Base) MCG/ACT inhaler Inhale 2 puffs into the lungs every 4 (four) hours as needed for wheezing or shortness of breath. 02/06/17   Hedges, Tinnie Gens, PA-C  amoxicillin-clavulanate (AUGMENTIN) 875-125 MG tablet Take 1 tablet by mouth every 12 (twelve) hours. 12/05/20   Melene Plan, DO  cetirizine (ZYRTEC) 10 MG tablet Take 10 mg by mouth daily as needed for allergies.    [provider]  Hyoscyamine Sulfate SL (LEVSIN/SL) 0.125 MG SUBL Place 0.125 mg under the tongue 2 (two) times daily as needed. Patient not taking: Reported on 12/05/2020 11/02/20    Raspet, Noberto Retort, PA-C  ibuprofen (ADVIL) 800 MG tablet Take 1 tablet (800 mg total) by mouth 3 (three) times daily. Patient taking differently: Take 800 mg by mouth every 8 (eight) hours as needed for headache or moderate pain. 11/29/20   White, Elita Boone, NP  lidocaine (XYLOCAINE) 2 % solution Use as directed 15 mLs in the mouth or throat every 4 (four) hours as needed for mouth pain. 11/29/20   Valinda Hoar, NP    Allergies    Patient has no known allergies.  Review of Systems   Review of Systems  All other systems reviewed and are negative.  Physical Exam Updated Vital Signs BP 137/83 (BP Location: Right Arm)   Pulse 74   Temp 99 F (37.2 C) (Oral)   Resp 16   Ht 1.981 m (6\' 6" )   Wt 86.2 kg   SpO2 100%   BMI 21.96 kg/m   Physical Exam Vitals and nursing note reviewed.  Constitutional:      Appearance: He is well-developed. He is not toxic-appearing.  HENT:     Head: Normocephalic and atraumatic.  Eyes:     Conjunctiva/sclera: Conjunctivae normal.     Pupils: Pupils are equal, round, and reactive to light.     Comments: Edema noted to left upper eyelid.  No evidence of entrapment.  No exophthalmos.  No drainage appreciated.  Cardiovascular:     Rate and Rhythm: Normal rate.  Pulmonary:     Effort: Pulmonary effort is normal.  Musculoskeletal:     Cervical back: Normal range of motion.  Skin:    General: Skin is warm and dry.  Neurological:     Mental Status: He is alert and oriented to person, place, and time.    ED Results / Procedures / Treatments   Labs (all labs ordered are listed, but only abnormal results are displayed) Labs Reviewed - No data to display  EKG None  Radiology No results found.  Procedures Procedures   Medications Ordered in ED Medications - No data to display  ED Course  I have reviewed the triage vital signs and the nursing notes.  Pertinent labs & imaging results that were available during my care of the patient were  reviewed by me and considered in my medical decision making (see chart for details).    MDM Rules/Calculators/A&P                           Patient with likely developing preseptal cellulitis.  Patient is on Augmentin at this time.  He is afebrile.  Have informed to continue his current therapy and return precautions given final Clinical Impression(s) / ED Diagnoses Final diagnoses:  None    Rx / DC Orders ED Discharge Orders     None        Lorre Nick, MD 12/07/20 7353    Lorre Nick, MD 12/07/20 [phone]

## 2021-03-28 ENCOUNTER — Other Ambulatory Visit: Payer: Self-pay

## 2021-03-28 ENCOUNTER — Encounter (HOSPITAL_BASED_OUTPATIENT_CLINIC_OR_DEPARTMENT_OTHER): Payer: Self-pay | Admitting: Emergency Medicine

## 2021-03-28 DIAGNOSIS — J45909 Unspecified asthma, uncomplicated: Secondary | ICD-10-CM | POA: Insufficient documentation

## 2021-03-28 DIAGNOSIS — L02416 Cutaneous abscess of left lower limb: Secondary | ICD-10-CM | POA: Diagnosis not present

## 2021-03-28 NOTE — ED Triage Notes (Signed)
Abscess on left leg just below left knee. Pt first noticed 3 days ago and stated that when he walks it drains a clear, yellow drainage.  ?

## 2021-03-29 ENCOUNTER — Emergency Department (HOSPITAL_BASED_OUTPATIENT_CLINIC_OR_DEPARTMENT_OTHER)
Admission: EM | Admit: 2021-03-29 | Discharge: 2021-03-29 | Disposition: A | Discharge status: Home / Self Care | Payer: Medicaid Other | Attending: Emergency Medicine | Admitting: Emergency Medicine

## 2021-03-29 DIAGNOSIS — L02416 Cutaneous abscess of left lower limb: Secondary | ICD-10-CM

## 2021-03-29 MED ORDER — SULFAMETHOXAZOLE-TRIMETHOPRIM 800-160 MG PO TABS: 1.0000 | ORAL_TABLET | Freq: Two times a day (BID) | ORAL | 0 refills | Status: AC

## 2021-03-29 NOTE — ED Provider Notes (Signed)
?[facility]PT ?Provider Note ? ?CSN: [id number] ?Arrival date & time: 03/28/21 2156 ? ?Chief Complaint(s) ?Abscess ? ?HPI ?Billy Salas is a 26 y.o. male   ? ? ?Abscess ?Location:  Leg ?Leg abscess location:  L lower leg ?Size:  1.5 cm ?Abscess quality: draining, induration, painful, redness and warmth   ?Duration:  2 days ?Pain details:  ?  Quality:  Dull ?  Severity:  Moderate ?  Timing:  Constant ?Chronicity:  New ?Context: not diabetes, not insect bite/sting and not skin injury   ?Relieved by:  Nothing ?Worsened by:  Nothing ?Associated symptoms: no fever   ? ?Past Medical History ?Past Medical History:  ?Diagnosis Date  ? Asthma   ? Migraine   ? optic migraine  ? ?Patient Active Problem List  ? Diagnosis Date Noted  ? PCP NOTES >>>>>>>>>>> 10/11/2015  ? SOB (shortness of breath) 04/01/2013  ? Annual physical exam 10/10/2011  ? ?Home Medication(s) ?Prior to Admission medications   ?Medication Sig Start Date End Date Taking? Authorizing Provider  ?sulfamethoxazole-trimethoprim (BACTRIM DS) 800-160 MG tablet Take 1 tablet by mouth 2 (two) times daily for 7 days. 03/29/21 04/05/21 Yes Danni Leabo, Amadeo Garnet, MD  ?albuterol (PROVENTIL HFA;VENTOLIN HFA) 108 (90 Base) MCG/ACT inhaler Inhale 2 puffs into the lungs every 4 (four) hours as needed for wheezing or shortness of breath. 02/06/17   Hedges, Tinnie Gens, PA-C  ?amoxicillin-clavulanate (AUGMENTIN) 875-125 MG tablet Take 1 tablet by mouth every 12 (twelve) hours. 12/05/20   Melene Plan, DO  ?cetirizine (ZYRTEC) 10 MG tablet Take 10 mg by mouth daily as needed for allergies.    [provider]  ?Hyoscyamine Sulfate SL (LEVSIN/SL) 0.125 MG SUBL Place 0.125 mg under the tongue 2 (two) times daily as needed. ?Patient not taking: Reported on 12/05/2020 11/02/20   Raspet, Denny Peon K, PA-C  ?ibuprofen (ADVIL) 800 MG tablet Take 1 tablet (800 mg total) by mouth 3 (three) times daily. ?Patient taking differently: Take 800 mg by mouth every 8  (eight) hours as needed for headache or moderate pain. 11/29/20   Valinda Hoar, NP  ?lidocaine (XYLOCAINE) 2 % solution Use as directed 15 mLs in the mouth or throat every 4 (four) hours as needed for mouth pain. 11/29/20   Valinda Hoar, NP  ?                                                                                                                                  ?Allergies ?Patient has no known allergies. ? ?Review of Systems ?Review of Systems  ?Constitutional:  Negative for fever.  ?As noted in HPI ? ?Physical Exam ?Vital Signs  ?I have reviewed the triage vital signs ?BP 123/81   Pulse (!) 46   Temp 98.2 ?F (36.8 ?C)   Resp 18   Ht 6\' 6"  (1.981 m)   Wt 88.5 kg   SpO2 100%   BMI 22.53 kg/m?  ? ?  Physical Exam ?Vitals reviewed.  ?Constitutional:   ?   General: He is not in acute distress. ?   Appearance: He is well-developed. He is not diaphoretic.  ?HENT:  ?   Head: Normocephalic and atraumatic.  ?   Right Ear: External ear normal.  ?   Left Ear: External ear normal.  ?   Nose: Nose normal.  ?   Mouth/Throat:  ?   Mouth: Mucous membranes are moist.  ?Eyes:  ?   General: No scleral icterus. ?   Conjunctiva/sclera: Conjunctivae normal.  ?Neck:  ?   Trachea: Phonation normal.  ?Cardiovascular:  ?   Rate and Rhythm: Normal rate and regular rhythm.  ?Pulmonary:  ?   Effort: Pulmonary effort is normal. No respiratory distress.  ?   Breath sounds: No stridor.  ?Abdominal:  ?   General: There is no distension.  ?Musculoskeletal:     ?   General: Normal range of motion.  ?   Cervical back: Normal range of motion.  ?     Legs: ? ?Neurological:  ?   Mental Status: He is alert and oriented to person, place, and time.  ?Psychiatric:     ?   Behavior: Behavior normal.  ? ? ?ED Results and Treatments ?Labs ?(all labs ordered are listed, but only abnormal results are displayed) ?Labs Reviewed - No data to display                                                                                                                        ?EKG ? EKG Interpretation ? ?Date/Time:    ?Ventricular Rate:    ?PR Interval:    ?QRS Duration:   ?QT Interval:    ?QTC Calculation:   ?R Axis:     ?Text Interpretation:   ?  ? ?  ? ?Radiology ?No results found. ? ?Pertinent labs & imaging results that were available during my care of the patient were reviewed by me and considered in my medical decision making (see MDM for details). ? ?Medications Ordered in ED ?Medications - No data to display                                                               ?                                                                    ?Procedures ?Procedures ? ?(including critical care time) ? ?Medical Decision Making / ED Course ? ? ? Complexity of Problem: ? ?  Co-morbidities/SDOH that complicate the patient evaluation/care: ?none ? ?Additional history obtained: ?N/a ? ?Patient's presenting problem/concern and DDX listed below: ?Leg wound ?Abscess with cellulitis ? ? ?  Complexity of Data: ?  ?Cardiac Monitoring: ?N/a ? ?Laboratory Tests ordered listed below with my independent interpretation: ?none ?  ?Imaging Studies ordered listed below with my independent interpretation: ?none ?  ?  ?ED Course:   ? ?Hospitalization Considered:  ?no ? ?Assessment, Intervention, and Reassessment: ?Abscess with cellulitis ?Already drained ?No need for I&D here ?RX bactrim  ? ? ?Final Clinical Impression(s) / ED Diagnoses ?Final diagnoses:  ?Cutaneous abscess of left lower extremity  ? ?The patient appears reasonably screened and/or stabilized for discharge and I doubt any other medical condition or other [facility] requiring further screening, evaluation, or treatment in the ED at this time prior to discharge. Safe for discharge with strict return precautions. ? ?Disposition: Discharge ? ?Condition: Good ? ?I have discussed the results, Dx and Tx plan with the patient/family who expressed understanding and agree(s) with the plan. Discharge instructions discussed at length. The  patient/family was given strict return precautions who verbalized understanding of the instructions. No further questions at time of discharge.  ? ? ?ED Discharge Orders   ? ?      Ordered  ?  sulfamethoxazole-trimethoprim (BACTRIM DS) 800-160 MG tablet  2 times daily       ? 03/29/21 0321  ? ?  ?  ? ?  ? ? ? ?Follow Up: ?Primary care provider ? ?Call  ?if symptoms do not improve or  worsen, in 5-7 days ? ? ? ?  ? ? ? ? ? ?This chart was dictated using voice recognition software.  Despite best efforts to proofread,  errors can occur which can change the documentation meaning. ? ?  ?Nira Conn, MD ?03/29/21 [phone] ? ?

## 2021-06-23 ENCOUNTER — Other Ambulatory Visit (HOSPITAL_BASED_OUTPATIENT_CLINIC_OR_DEPARTMENT_OTHER): Payer: Self-pay

## 2021-06-23 ENCOUNTER — Emergency Department (HOSPITAL_BASED_OUTPATIENT_CLINIC_OR_DEPARTMENT_OTHER)
Admission: EM | Admit: 2021-06-23 | Discharge: 2021-06-23 | Disposition: A | Discharge status: Home / Self Care | Payer: No Typology Code available for payment source | Attending: Emergency Medicine | Admitting: Emergency Medicine

## 2021-06-23 ENCOUNTER — Other Ambulatory Visit: Payer: Self-pay

## 2021-06-23 ENCOUNTER — Emergency Department (HOSPITAL_BASED_OUTPATIENT_CLINIC_OR_DEPARTMENT_OTHER): Payer: No Typology Code available for payment source | Admitting: Radiology

## 2021-06-23 ENCOUNTER — Encounter (HOSPITAL_BASED_OUTPATIENT_CLINIC_OR_DEPARTMENT_OTHER): Payer: Self-pay

## 2021-06-23 DIAGNOSIS — M545 Low back pain, unspecified: Secondary | ICD-10-CM | POA: Diagnosis present

## 2021-06-23 DIAGNOSIS — Y92410 Unspecified street and highway as the place of occurrence of the external cause: Secondary | ICD-10-CM | POA: Diagnosis not present

## 2021-06-23 DIAGNOSIS — M5410 Radiculopathy, site unspecified: Secondary | ICD-10-CM

## 2021-06-23 DIAGNOSIS — M5416 Radiculopathy, lumbar region: Secondary | ICD-10-CM | POA: Diagnosis not present

## 2021-06-23 MED ORDER — MELOXICAM 15 MG PO TABS
15.0000 mg | ORAL_TABLET | Freq: Every day | ORAL | 0 refills | Status: AC
  Filled 2021-06-23: qty 15, 15d supply, fill #0

## 2021-06-23 MED ORDER — METHOCARBAMOL 500 MG PO TABS
500.0000 mg | ORAL_TABLET | Freq: Two times a day (BID) | ORAL | 0 refills | Status: AC
  Filled 2021-06-23: qty 20, 10d supply, fill #0
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
## 2021-06-23 NOTE — Discharge Instructions (Addendum)
You were seen today for pain of the lower back and left leg.  Images showed no fracture or dislocation.  Your pain sounds consistent with radicular nerve pain.  I prescribed an anti-inflammatory and a muscle relaxant.  Do not take other NSAID medications while taking the meloxicam.  You may use ice or heat as beneficial.  Follow-up as needed with orthopedics.

## 2021-06-23 NOTE — ED Notes (Signed)
Discharge paperwork given and understood. 
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
## 2021-07-05 ENCOUNTER — Other Ambulatory Visit (HOSPITAL_BASED_OUTPATIENT_CLINIC_OR_DEPARTMENT_OTHER): Payer: Self-pay

## 2021-12-22 DIAGNOSIS — Z419 Encounter for procedure for purposes other than remedying health state, unspecified: Secondary | ICD-10-CM | POA: Diagnosis not present

## 2022-01-22 DIAGNOSIS — Z419 Encounter for procedure for purposes other than remedying health state, unspecified: Secondary | ICD-10-CM | POA: Diagnosis not present

## 2022-02-22 DIAGNOSIS — Z419 Encounter for procedure for purposes other than remedying health state, unspecified: Secondary | ICD-10-CM | POA: Diagnosis not present

## 2022-03-07 ENCOUNTER — Telehealth: Payer: Self-pay

## 2022-03-07 NOTE — Telephone Encounter (Signed)
Mychart msg sent. AS, CMA

## 2022-03-23 DIAGNOSIS — Z419 Encounter for procedure for purposes other than remedying health state, unspecified: Secondary | ICD-10-CM | POA: Diagnosis not present

## 2022-04-23 DIAGNOSIS — Z419 Encounter for procedure for purposes other than remedying health state, unspecified: Secondary | ICD-10-CM | POA: Diagnosis not present

## 2022-05-23 DIAGNOSIS — Z419 Encounter for procedure for purposes other than remedying health state, unspecified: Secondary | ICD-10-CM | POA: Diagnosis not present

## 2022-06-23 DIAGNOSIS — Z419 Encounter for procedure for purposes other than remedying health state, unspecified: Secondary | ICD-10-CM | POA: Diagnosis not present

## 2022-07-23 DIAGNOSIS — Z419 Encounter for procedure for purposes other than remedying health state, unspecified: Secondary | ICD-10-CM | POA: Diagnosis not present

## 2022-07-24 ENCOUNTER — Other Ambulatory Visit: Payer: Self-pay

## 2022-08-23 DIAGNOSIS — Z419 Encounter for procedure for purposes other than remedying health state, unspecified: Secondary | ICD-10-CM | POA: Diagnosis not present

## 2022-09-23 DIAGNOSIS — Z419 Encounter for procedure for purposes other than remedying health state, unspecified: Secondary | ICD-10-CM | POA: Diagnosis not present

## 2022-10-11 ENCOUNTER — Ambulatory Visit: Payer: Self-pay | Admitting: *Deleted

## 2022-10-11 NOTE — Telephone Encounter (Signed)
  Chief Complaint: requesting work note Symptoms: hx migraine headaches and reports need to wear sunglasses at work to prevent migraines due to light sensitivity.  Frequency: na Pertinent Negatives: Patient denies migraine now  Disposition: [] ED /[] Urgent Care (no appt availability in office) / [] Appointment(In office/virtual)/ []  [facility]/ [] Home Care/ [x] Refused Recommended Disposition /[] [facility]/ []  Follow-up with PCP Additional Notes:   Recommended to schedule new patient appt and establish care with a provider. Patient declined at this time. Offered patient can go to mobile bus for sx or issues with migraines and request if note can be given or would need to go back to ED where he was dx with migraines.       Reason for Disposition  General information question, no triage required and triager able to answer question  Answer Assessment - Initial Assessment Questions 1. REASON FOR CALL or QUESTION: "What is your reason for calling today?" or "How can I best help you?" or "What question do you have that I can help answer?"     Requesting work note for wearing sunglasses at work to prevent migraines if needed.  Protocols used: Information Only Call - No Triage-A-AH

## 2022-10-23 DIAGNOSIS — Z419 Encounter for procedure for purposes other than remedying health state, unspecified: Secondary | ICD-10-CM | POA: Diagnosis not present

## 2022-11-23 DIAGNOSIS — Z419 Encounter for procedure for purposes other than remedying health state, unspecified: Secondary | ICD-10-CM | POA: Diagnosis not present

## 2022-12-23 DIAGNOSIS — Z419 Encounter for procedure for purposes other than remedying health state, unspecified: Secondary | ICD-10-CM | POA: Diagnosis not present

## 2023-01-23 DIAGNOSIS — Z419 Encounter for procedure for purposes other than remedying health state, unspecified: Secondary | ICD-10-CM | POA: Diagnosis not present

## 2023-02-23 DIAGNOSIS — Z419 Encounter for procedure for purposes other than remedying health state, unspecified: Secondary | ICD-10-CM | POA: Diagnosis not present

## 2023-03-23 DIAGNOSIS — Z419 Encounter for procedure for purposes other than remedying health state, unspecified: Secondary | ICD-10-CM | POA: Diagnosis not present

## 2023-05-04 DIAGNOSIS — Z419 Encounter for procedure for purposes other than remedying health state, unspecified: Secondary | ICD-10-CM | POA: Diagnosis not present

## 2023-06-03 DIAGNOSIS — Z419 Encounter for procedure for purposes other than remedying health state, unspecified: Secondary | ICD-10-CM | POA: Diagnosis not present

## 2023-07-02 ENCOUNTER — Emergency Department (HOSPITAL_COMMUNITY)
Admission: EM | Admit: 2023-07-02 | Discharge: 2023-07-03 | Disposition: A | Discharge status: Home / Self Care | Attending: Emergency Medicine | Admitting: Emergency Medicine

## 2023-07-02 ENCOUNTER — Emergency Department (HOSPITAL_COMMUNITY)

## 2023-07-02 ENCOUNTER — Encounter (HOSPITAL_COMMUNITY): Payer: Self-pay

## 2023-07-02 ENCOUNTER — Ambulatory Visit
Admission: EM | Admit: 2023-07-02 | Discharge: 2023-07-02 | Disposition: A | Discharge status: Home / Self Care | Attending: Family Medicine | Admitting: Family Medicine

## 2023-07-02 ENCOUNTER — Other Ambulatory Visit: Payer: Self-pay

## 2023-07-02 DIAGNOSIS — J45909 Unspecified asthma, uncomplicated: Secondary | ICD-10-CM | POA: Insufficient documentation

## 2023-07-02 DIAGNOSIS — R519 Headache, unspecified: Secondary | ICD-10-CM | POA: Insufficient documentation

## 2023-07-02 DIAGNOSIS — R0602 Shortness of breath: Secondary | ICD-10-CM | POA: Diagnosis not present

## 2023-07-02 DIAGNOSIS — R042 Hemoptysis: Secondary | ICD-10-CM | POA: Diagnosis not present

## 2023-07-02 DIAGNOSIS — R0789 Other chest pain: Secondary | ICD-10-CM | POA: Insufficient documentation

## 2023-07-02 DIAGNOSIS — R079 Chest pain, unspecified: Secondary | ICD-10-CM | POA: Diagnosis not present

## 2023-07-02 LAB — RESP PANEL BY RT-PCR (RSV, FLU A&B, COVID)  RVPGX2
Influenza A by PCR: NEGATIVE
Influenza B by PCR: NEGATIVE
Resp Syncytial Virus by PCR: NEGATIVE
SARS Coronavirus 2 by RT PCR: NEGATIVE

## 2023-07-02 LAB — CBC
HCT: 44.9 % (ref 39.0–52.0)
Hemoglobin: 14.5 g/dL (ref 13.0–17.0)
MCH: 29.7 pg (ref 26.0–34.0)
MCHC: 32.3 g/dL (ref 30.0–36.0)
MCV: 92 fL (ref 80.0–100.0)
Platelets: 230 10*3/uL (ref 150–400)
RBC: 4.88 MIL/uL (ref 4.22–5.81)
RDW: 12.7 % (ref 11.5–15.5)
WBC: 9.4 10*3/uL (ref 4.0–10.5)
nRBC: 0 % (ref 0.0–0.2)

## 2023-07-02 LAB — COMPREHENSIVE METABOLIC PANEL WITH GFR
ALT: 18 U/L (ref 0–44)
AST: 23 U/L (ref 15–41)
Albumin: 4.3 g/dL (ref 3.5–5.0)
Alkaline Phosphatase: 78 U/L (ref 38–126)
Anion gap: 11 (ref 5–15)
BUN: 9 mg/dL (ref 6–20)
CO2: 24 mmol/L (ref 22–32)
Calcium: 9.6 mg/dL (ref 8.9–10.3)
Chloride: 102 mmol/L (ref 98–111)
Creatinine, Ser: 0.86 mg/dL (ref 0.61–1.24)
GFR, Estimated: >60 mL/min (ref >60)
Glucose, Bld: 96 mg/dL (ref 70–99)
Potassium: 3.7 mmol/L (ref 3.5–5.1)
Sodium: 137 mmol/L (ref 135–145)
Total Bilirubin: 1.9 mg/dL — ABNORMAL HIGH (ref 0.0–1.2)
Total Protein: 8.2 g/dL — ABNORMAL HIGH (ref 6.5–8.1)

## 2023-07-02 LAB — TROPONIN I (HIGH SENSITIVITY)
Troponin I (High Sensitivity): 3 ng/L (ref <18)
Troponin I (High Sensitivity): 2 ng/L (ref <18)

## 2023-07-02 NOTE — ED Provider Notes (Signed)
 [place] EMERGENCY DEPARTMENT AT [facility] Provider Note   CSN: [id number] Arrival date & time: 07/02/23  2030     History {Add pertinent medical, surgical, social history, OB history to HPI:1} Chief Complaint  Patient presents with   Chest Pain    Billy Salas is a 28 y.o. male.  Patient presents the emergency room complaining of chest pain with associated shortness of breath.  He states that this morning he woke up and had sharp chest pain in the center of his chest.  It is worse with deep breathing.  He states that movement makes the pain worse including leaning forward and moving his upper extremities.  He has a history of asthma but states this shortness of breath feels different.  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  He also helped somewhat off the ground this weekend.  He denies any other vigorous physical activity over the past few days and denies any known injuries.  Patient denies abdominal pain, nausea, vomiting, fever.  He did go to urgent care earlier today who recommended he come to the emergency department due to concern about possible pulmonary embolism.   Chest Pain      Home Medications Prior to Admission medications   Medication Sig Start Date End Date Taking? Authorizing Provider  albuterol  (PROVENTIL  HFA;VENTOLIN  HFA) 108 (90 Base) MCG/ACT inhaler Inhale 2 puffs into the lungs every 4 (four) hours as needed for wheezing or shortness of breath. 02/06/17   Hedges, Susana Enter, PA-C  amoxicillin -clavulanate (AUGMENTIN ) 875-125 MG tablet Take 1 tablet by mouth every 12 (twelve) hours. 12/05/20   Floyd, Dan, DO  cetirizine (ZYRTEC) 10 MG tablet Take 10 mg by mouth daily as needed for allergies.    [provider]  Hyoscyamine  Sulfate SL (LEVSIN/SL) 0.125 MG SUBL Place 0.125 mg under the tongue 2 (two) times daily as needed. Patient not taking: Reported on 12/05/2020 11/02/20   Raspet, Erin K, PA-C   ibuprofen  (ADVIL ) 800 MG tablet Take 1 tablet (800 mg total) by mouth 3 (three) times daily. Patient taking differently: Take 800 mg by mouth every 8 (eight) hours as needed for headache or moderate pain. 11/29/20   White, Maybelle Spatz, NP  lidocaine  (XYLOCAINE ) 2 % solution Use as directed 15 mLs in the mouth or throat every 4 (four) hours as needed for mouth pain. 11/29/20   White, Maybelle Spatz, NP  methocarbamol  (ROBAXIN ) 500 MG tablet Take 1 tablet (500 mg total) by mouth 2 (two) times daily. 06/23/21   Elisa Guest, PA-C      Allergies    Patient has no known allergies.    Review of Systems   Review of Systems  Cardiovascular:  Positive for chest pain.    Physical Exam Updated Vital Signs BP 125/76   Pulse 86   Temp 98.8 F (37.1 C) (Oral)   Resp 20   SpO2 100%  Physical Exam Vitals and nursing note reviewed.  Constitutional:      General: He is not in acute distress.    Appearance: He is well-developed.  HENT:     Head: Normocephalic and atraumatic.  Eyes:     Conjunctiva/sclera: Conjunctivae normal.  Cardiovascular:     Rate and Rhythm: Normal rate and regular rhythm.     Heart sounds: No murmur heard. Pulmonary:     Effort: Pulmonary effort is normal. No respiratory distress.     Breath sounds: Normal  breath sounds.  Chest:     Chest wall: Tenderness present.     Comments: Generalized chest wall tenderness Abdominal:     Palpations: Abdomen is soft.     Tenderness: There is no abdominal tenderness.  Musculoskeletal:        General: No swelling.     Cervical back: Neck supple.  Skin:    General: Skin is warm and dry.     Capillary Refill: Capillary refill takes less than 2 seconds.  Neurological:     Mental Status: He is alert.  Psychiatric:        Mood and Affect: Mood normal.     ED Results / Procedures / Treatments   Labs (all labs ordered are listed, but only abnormal results are displayed) Labs Reviewed  COMPREHENSIVE METABOLIC PANEL WITH GFR -  Abnormal; Notable for the following components:      Result Value   Total Protein 8.2 (*)    Total Bilirubin 1.9 (*)    All other components within normal limits  RESP PANEL BY RT-PCR (RSV, FLU A&B, COVID)  RVPGX2  CBC  D-DIMER, QUANTITATIVE  TROPONIN I (HIGH SENSITIVITY)  TROPONIN I (HIGH SENSITIVITY)    EKG None  Radiology DG Chest 2 View Result Date: 07/02/2023 CLINICAL DATA:  CP/SOB EXAM: CHEST - 2 VIEW COMPARISON:  02/06/2017. FINDINGS: Cardiac silhouette is unremarkable. No pneumothorax or pleural effusion. The lungs are clear. The visualized skeletal structures are unremarkable. IMPRESSION: No acute cardiopulmonary process. Electronically Signed   By: Sydell Eva M.D.   On: 07/02/2023 20:58    Procedures Procedures  {Document cardiac monitor, telemetry assessment procedure when appropriate:1}  Medications Ordered in ED Medications - No data to display  ED Course/ Medical Decision Making/ A&P   {   Click here for ABCD2, HEART and other calculatorsREFRESH Note before signing :1}                              Medical Decision Making Amount and/or Complexity of Data Reviewed Labs: ordered.   This patient presents to the ED for concern of chest pain, this involves an extensive number of treatment options, and is a complaint that carries with it a high risk of complications and morbidity.  The differential diagnosis includes musculoskeletal pain, pulmonary embolism, pneumonia, asthma exacerbation, ACS, others   Co morbidities / Chronic conditions that complicate the patient evaluation  Asthma   Additional history obtained:  Additional history obtained from EMR External records from outside source obtained and reviewed including urgent care notes   Lab Tests:  I Ordered, and personally interpreted labs.  The pertinent results include: Unremarkable CMP, CBC, negative respiratory panel, troponin 3   Imaging Studies ordered:  I ordered imaging studies  including chest x-ray I independently visualized and interpreted imaging which showed no acute findings I agree with the radiologist interpretation   Cardiac Monitoring: / EKG:  The patient was maintained on a cardiac monitor.  I personally viewed and interpreted the cardiac monitored which showed an underlying rhythm of: Sinus rhythm   Problem List / ED Course / Critical interventions / Medication management  *** I ordered medication including ***   Reevaluation of the patient after these medicines showed that the patient *** I have reviewed the patients home medicines and have made adjustments as needed   Consultations Obtained:  I requested consultation with the ***,  and discussed lab and imaging findings as well as  pertinent plan - they recommend: ***   Social Determinants of Health:  ***   Test / Admission - Considered:  ***   {Document critical care time when appropriate:1} {Document review of labs and clinical decision tools ie heart score, Chads2Vasc2 etc:1}  {Document your independent review of radiology images, and any outside records:1} {Document your discussion with family members, caretakers, and with consultants:1} {Document social determinants of health affecting pt's care:1} {Document your decision making why or why not admission, treatments were needed:1} Final Clinical Impression(s) / ED Diagnoses Final diagnoses:  None    Rx / DC Orders ED Discharge Orders     None

## 2023-07-02 NOTE — Discharge Instructions (Addendum)
 Please go to the ER for further evaluation of your symptoms

## 2023-07-02 NOTE — ED Triage Notes (Signed)
 Pt. Arrives for chest pain and SOB. States that he though he slept wrong but it continued to have sharp chest pain when taking a deep breath. Hx of asthma. States that SOB is different from his asthma. Denies nausea and vomiting. States that he was lightheaded earlier but it has subsided. Also states that his throat is hurting.

## 2023-07-02 NOTE — ED Notes (Signed)
 Patient is being discharged from the Urgent Care and sent to the Emergency Department via POV . Per Ramonita Burow NP, patient is in need of higher level of care due to chest tightness and SOB. Patient is aware and verbalizes understanding of plan of care.  Vitals:   07/02/23 2002 07/02/23 2003  BP:  125/79  Pulse: 76   Temp:  98.9 F (37.2 C)  SpO2: 98%

## 2023-07-02 NOTE — ED Provider Notes (Signed)
 [facility]    CSN: [id number] Arrival date & time: 07/02/23  1959      History   Chief Complaint Chief Complaint  Patient presents with   Chest Pain   Shortness of Breath    HPI Billy Salas is a 28 y.o. male with a past medical history of asthma presents for shortness of breath, hemoptysis, chest pain.  Patient reports he awoke this morning with midsternal chest pain that has been persistent and nonradiating.  It is associated with shortness of breath and he does report 1 episode of hemoptysis.  He denies any cough, congestion, injury, sore throat, fevers, nausea/vomiting.  No smoking history.  He states this does not feel typical for his asthma.  Denies any orthopnea or long distance travel.  No lower extremity swelling.  No other concerns at this time.  Chest Pain Associated symptoms: shortness of breath   Shortness of Breath Associated symptoms: chest pain     Past Medical History:  Diagnosis Date   Asthma    Migraine    optic migraine    Patient Active Problem List   Diagnosis Date Noted   PCP NOTES >>>>>>>>>>> 10/11/2015   SOB (shortness of breath) 04/01/2013   Annual physical exam 10/10/2011    Past Surgical History:  Procedure Laterality Date   CLOSED REDUCTION WRIST FRACTURE         Home Medications    Prior to Admission medications   Medication Sig Start Date End Date Taking? Authorizing Provider  albuterol  (PROVENTIL  HFA;VENTOLIN  HFA) 108 (90 Base) MCG/ACT inhaler Inhale 2 puffs into the lungs every 4 (four) hours as needed for wheezing or shortness of breath. 02/06/17   Hedges, Susana Enter, PA-C  amoxicillin -clavulanate (AUGMENTIN ) 875-125 MG tablet Take 1 tablet by mouth every 12 (twelve) hours. 12/05/20   Floyd, Dan, DO  cetirizine (ZYRTEC) 10 MG tablet Take 10 mg by mouth daily as needed for allergies.    [provider]  Hyoscyamine  Sulfate SL (LEVSIN/SL) 0.125 MG SUBL Place 0.125 mg under the tongue 2 (two) times daily as  needed. Patient not taking: Reported on 12/05/2020 11/02/20   Raspet, Erin K, PA-C  ibuprofen  (ADVIL ) 800 MG tablet Take 1 tablet (800 mg total) by mouth 3 (three) times daily. Patient taking differently: Take 800 mg by mouth every 8 (eight) hours as needed for headache or moderate pain. 11/29/20   White, Maybelle Spatz, NP  lidocaine  (XYLOCAINE ) 2 % solution Use as directed 15 mLs in the mouth or throat every 4 (four) hours as needed for mouth pain. 11/29/20   White, Maybelle Spatz, NP  methocarbamol  (ROBAXIN ) 500 MG tablet Take 1 tablet (500 mg total) by mouth 2 (two) times daily. 06/23/21   Elisa Guest, PA-C    Family History Family History  Problem Relation Age of Onset   Heart failure Other     Social History Social History   Tobacco Use   Smoking status: Never   Smokeless tobacco: Never  Vaping Use   Vaping status: Never Used  Substance Use Topics   Alcohol use: Yes    Comment: occ   Drug use: Yes    Types: Marijuana    Comment: daily use     Allergies   Patient has no known allergies.   Review of Systems Review of Systems  Respiratory:  Positive for shortness of breath.   Cardiovascular:  Positive for chest pain.     Physical Exam Triage Vital Signs ED Triage Vitals  Encounter Vitals Group     BP 07/02/23 2003 125/79     Systolic BP Percentile --      Diastolic BP Percentile --      Pulse Rate 07/02/23 2002 76     Resp --      Temp 07/02/23 2003 98.9 F (37.2 C)     Temp Source 07/02/23 2002 Oral     SpO2 07/02/23 2002 98 %     Weight --      Height --      Head Circumference --      Peak Flow --      Pain Score --      Pain Loc --      Pain Education --      Exclude from Growth Chart --    No data found.  Updated Vital Signs BP 125/79   Pulse 76   Temp 98.9 F (37.2 C)   SpO2 98%   Visual Acuity Right Eye Distance:   Left Eye Distance:   Bilateral Distance:    Right Eye Near:   Left Eye Near:    Bilateral Near:     Physical  Exam Vitals and nursing note reviewed.  Constitutional:      General: He is not in acute distress.    Appearance: Normal appearance. He is not ill-appearing.     Comments: Patient appears uncomfortable  HENT:     Head: Normocephalic and atraumatic.  Eyes:     Pupils: Pupils are equal, round, and reactive to light.  Cardiovascular:     Rate and Rhythm: Normal rate and regular rhythm.     Heart sounds: Normal heart sounds.     Comments: Pectus excavatum noted Pulmonary:     Effort: Pulmonary effort is normal.     Breath sounds: Normal breath sounds. No wheezing or rhonchi.  Chest:     Chest wall: No tenderness.  Skin:    General: Skin is warm and dry.  Neurological:     General: No focal deficit present.     Mental Status: He is alert and oriented to person, place, and time.  Psychiatric:        Mood and Affect: Mood normal.        Behavior: Behavior normal.    PERC Criteria for low probability for Pulmonary Embolism  Age <50 years  Heart rate <100 beats/minute  Oxyhemoglobin saturation >=95 percent  No hemoptysis  No estrogen use  No prior DVT or PE  No unilateral leg swelling  No surgery/trauma requiring hospitalization within the prior four weeks  Total score: 1    UC Treatments / Results  Labs (all labs ordered are listed, but only abnormal results are displayed) Labs Reviewed - No data to display  EKG   Radiology No results found.  Procedures ED EKG  Date/Time: 07/02/2023 8:16 PM  Performed by: Alleen Arbour, NP Authorized by: Alleen Arbour, NP   ECG interpreted by ED Physician in the absence of a cardiologist: no   Previous ECG:    Previous ECG:  Unavailable Interpretation:    Interpretation: normal   Rate:    ECG rate:  64   ECG rate assessment: normal   Rhythm:    Rhythm: sinus rhythm   Ectopy:    Ectopy: none   QRS:    QRS conduction: normal   T waves:    T waves: non-specific    (including critical care time)  Medications  Ordered in  UC Medications - No data to display  Initial Impression / Assessment and Plan / UC Course  I have reviewed the triage vital signs and the nursing notes.  Pertinent labs & imaging results that were available during my care of the patient were reviewed by me and considered in my medical decision making (see chart for details).     I reviewed exam and symptoms with patient.  Discussed limitations and abilities of urgent care.  Patient presenting with 1 day of progressing chest pain shortness of breath with hemoptysis.  His chest wall is nontender to palpation.  His lungs are CTA.  EKG is unremarkable.  Although PERC score is 1 given presentation concern for PE and do feel it warrants  higher level of care.  I did advise he go to the emergency room for further evaluation.  He declined EMS transfer.  I discussed risk of going POV including heart attack, stroke, permanent disability, and/or death and he verbalized understanding but still chose to go POV with his girlfriend driving.  He was instructed to pull over and call 911 for any worsening symptoms that occur in transit and he verbalized understanding.  Patient hemodynamically stable at time of discharge. Final Clinical Impressions(s) / UC Diagnoses   Final diagnoses:  Shortness of breath  Hemoptysis     Discharge Instructions      Please go to the ER for further evaluation of your symptoms   ED Prescriptions   None    PDMP not reviewed this encounter.   Alleen Arbour, NP 07/02/23 2018

## 2023-07-02 NOTE — ED Notes (Signed)
 Pt present with intermittent chest tightness and SOB. Pt states he has not used his inhaler in years. Pt c/o tightness in chest when moving.

## 2023-07-03 LAB — D-DIMER, QUANTITATIVE: D-Dimer, Quant: <0.27 ug{FEU}/mL (ref 0.00–0.50)

## 2023-07-03 MED ORDER — PROCHLORPERAZINE MALEATE 10 MG PO TABS
10.0000 mg | ORAL_TABLET | Freq: Once | ORAL | Status: AC
  Administered 2023-07-03: 10 mg via ORAL
  Filled 2023-07-03: qty 1

## 2023-07-03 MED ORDER — IBUPROFEN 200 MG PO TABS
600.0000 mg | ORAL_TABLET | Freq: Once | ORAL | Status: AC
  Administered 2023-07-03: 600 mg via ORAL
  Filled 2023-07-03: qty 3

## 2023-07-03 NOTE — Discharge Instructions (Signed)
 Your workup tonight was reassuring.  If you develop any life-threatening symptoms please return to the emergency department for further evaluation.

## 2023-07-03 NOTE — ED Provider Notes (Incomplete)
 [place] [facility] AT [facility] Provider Note   CSN: [id number] Arrival date & time: 07/02/23  2030     History {Add pertinent medical, surgical, social history, OB history to HPI:1} Chief Complaint  Patient presents with  . Chest Pain    Billy Salas is a 28 y.o. male.  Patient presents the emergency room complaining of chest pain with associated shortness of breath.  He states that this morning he woke up and had sharp chest pain in the center of his chest.  It is worse with deep breathing.  He states that movement makes the pain worse including leaning forward and moving his upper extremities.  He has a history of asthma but states this shortness of breath feels different.  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  He also helped somewhat off the ground this weekend.  He denies any other vigorous physical activity over the past few days and denies any known injuries.  Patient denies abdominal pain, nausea, vomiting, fever.  He did go to urgent care earlier today who recommended he come to the emergency department due to concern about possible pulmonary embolism.   Chest Pain      Home Medications Prior to Admission medications   Medication Sig Start Date End Date Taking? Authorizing Provider  albuterol  (PROVENTIL  HFA;VENTOLIN  HFA) 108 (90 Base) MCG/ACT inhaler Inhale 2 puffs into the lungs every 4 (four) hours as needed for wheezing or shortness of breath. 02/06/17   Hedges, Susana Enter, PA-C  amoxicillin -clavulanate (AUGMENTIN ) 875-125 MG tablet Take 1 tablet by mouth every 12 (twelve) hours. 12/05/20   Floyd, Dan, DO  cetirizine (ZYRTEC) 10 MG tablet Take 10 mg by mouth daily as needed for allergies.    [provider]  Hyoscyamine  Sulfate SL (LEVSIN/SL) 0.125 MG SUBL Place 0.125 mg under the tongue 2 (two) times daily as needed. Patient not taking: Reported on 12/05/2020 11/02/20   Raspet, Erin K, PA-C   ibuprofen  (ADVIL ) 800 MG tablet Take 1 tablet (800 mg total) by mouth 3 (three) times daily. Patient taking differently: Take 800 mg by mouth every 8 (eight) hours as needed for headache or moderate pain. 11/29/20   White, Maybelle Spatz, NP  lidocaine  (XYLOCAINE ) 2 % solution Use as directed 15 mLs in the mouth or throat every 4 (four) hours as needed for mouth pain. 11/29/20   White, Maybelle Spatz, NP  methocarbamol  (ROBAXIN ) 500 MG tablet Take 1 tablet (500 mg total) by mouth 2 (two) times daily. 06/23/21   Elisa Guest, PA-C      Allergies    Patient has no known allergies.    Review of Systems   Review of Systems  Cardiovascular:  Positive for chest pain.    Physical Exam Updated Vital Signs BP 125/76   Pulse 86   Temp 98.8 F (37.1 C) (Oral)   Resp 20   SpO2 100%  Physical Exam Vitals and nursing note reviewed.  Constitutional:      General: He is not in acute distress.    Appearance: He is well-developed.  HENT:     Head: Normocephalic and atraumatic.  Eyes:     Conjunctiva/sclera: Conjunctivae normal.  Cardiovascular:     Rate and Rhythm: Normal rate and regular rhythm.     Heart sounds: No murmur heard. Pulmonary:     Effort: Pulmonary effort is normal. No respiratory distress.     Breath sounds: Normal  breath sounds.  Chest:     Chest wall: Tenderness present.     Comments: Generalized chest wall tenderness Abdominal:     Palpations: Abdomen is soft.     Tenderness: There is no abdominal tenderness.  Musculoskeletal:        General: No swelling.     Cervical back: Neck supple.  Skin:    General: Skin is warm and dry.     Capillary Refill: Capillary refill takes less than 2 seconds.  Neurological:     Mental Status: He is alert.  Psychiatric:        Mood and Affect: Mood normal.     ED Results / Procedures / Treatments   Labs (all labs ordered are listed, but only abnormal results are displayed) Labs Reviewed  COMPREHENSIVE METABOLIC PANEL WITH GFR -  Abnormal; Notable for the following components:      Result Value   Total Protein 8.2 (*)    Total Bilirubin 1.9 (*)    All other components within normal limits  RESP PANEL BY RT-PCR (RSV, FLU A&B, COVID)  [id number]  CBC  D-DIMER, QUANTITATIVE  TROPONIN I (HIGH SENSITIVITY)  TROPONIN I (HIGH SENSITIVITY)    EKG None  Radiology DG Chest 2 View Result Date: 07/02/2023 CLINICAL DATA:  CP/SOB EXAM: CHEST - 2 VIEW COMPARISON:  02/06/2017. FINDINGS: Cardiac silhouette is unremarkable. No pneumothorax or pleural effusion. The lungs are clear. The visualized skeletal structures are unremarkable. IMPRESSION: No acute cardiopulmonary process. Electronically Signed   By: Sydell Eva M.D.   On: 07/02/2023 20:58    Procedures Procedures  {Document cardiac monitor, telemetry assessment procedure when appropriate:1}  Medications Ordered in ED Medications - No data to display  ED Course/ Medical Decision Making/ A&P   {   Click here for ABCD2, HEART and other calculatorsREFRESH Note before signing :1}                              Medical Decision Making Amount and/or Complexity of Data Reviewed Labs: ordered.   This patient presents to the ED for concern of chest pain, this involves an extensive number of treatment options, and is a complaint that carries with it a high risk of complications and morbidity.  The differential diagnosis includes musculoskeletal pain, pulmonary embolism, pneumonia, asthma exacerbation, ACS, others   Co morbidities / Chronic conditions that complicate the patient evaluation  Asthma   Additional history obtained:  Additional history obtained from EMR External records from outside source obtained and reviewed including urgent care notes   Lab Tests:  I Ordered, and personally interpreted labs.  The pertinent results include: Unremarkable CMP, CBC, negative respiratory panel, troponin 3   Imaging Studies ordered:  I ordered imaging studies  including chest x-ray I independently visualized and interpreted imaging which showed no acute findings I agree with the radiologist interpretation   Cardiac Monitoring: / EKG:  The patient was maintained on a cardiac monitor.  I personally viewed and interpreted the cardiac monitored which showed an underlying rhythm of: Sinus rhythm   Problem List / ED Course / Critical interventions / Medication management  *** I ordered medication including ***   Reevaluation of the patient after these medicines showed that the patient *** I have reviewed the patients home medicines and have made adjustments as needed   Consultations Obtained:  I requested consultation with the ***,  and discussed lab and imaging findings as well as  pertinent plan - they recommend: ***   Social Determinants of Health:  Patient has Medicaid for his primary health insurance type   Test / Admission - Considered:  Patient with negative troponins and nonischemic EKG.  No sign of ACS.  No pneumonia on chest x-ray.  Low suspicion of PE based on history and physical..  D-dimer negative showing no clot burden.  Patient endorses a feeling of shortness of breath but oxygen is 100%, lungs are clear to auscultation bilaterally, patient is not tachycardic.  Clinically does not appear to be PE.  No indication for emergent CT chest.  Patient does have worsening of pain with movement.  This seems to be musculoskeletal in nature.  Plan to discharge home at this time with recommendation for anti-inflammatories at home.  Patient has no primary care provider.  If patient has worsening of symptoms he has been advised to return to the emergency department for further evaluation.  {Document critical care time when appropriate:1} {Document review of labs and clinical decision tools ie heart score, Chads2Vasc2 etc:1}  {Document your independent review of radiology images, and any outside records:1} {Document your discussion with family  members, caretakers, and with consultants:1} {Document social determinants of health affecting pt's care:1} {Document your decision making why or why not admission, treatments were needed:1} Final Clinical Impression(s) / ED Diagnoses Final diagnoses:  None    Rx / DC Orders ED Discharge Orders     None

## 2023-07-04 DIAGNOSIS — Z419 Encounter for procedure for purposes other than remedying health state, unspecified: Secondary | ICD-10-CM | POA: Diagnosis not present

## 2023-08-03 DIAGNOSIS — Z419 Encounter for procedure for purposes other than remedying health state, unspecified: Secondary | ICD-10-CM | POA: Diagnosis not present

## 2023-09-03 DIAGNOSIS — Z419 Encounter for procedure for purposes other than remedying health state, unspecified: Secondary | ICD-10-CM | POA: Diagnosis not present

## 2023-10-04 DIAGNOSIS — Z419 Encounter for procedure for purposes other than remedying health state, unspecified: Secondary | ICD-10-CM | POA: Diagnosis not present
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
